# Patient Record
Sex: Female | Born: 1960 | Race: Black or African American | Hispanic: No | Marital: Married
Health system: Southern US, Community
[De-identification: ages and names within clinical notes are randomized; demographics above are authoritative.]

---

## 1998-02-16 ENCOUNTER — Other Ambulatory Visit: Admission: RE | Admit: 1998-02-16 | Discharge: 1998-02-16 | Payer: Self-pay | Admitting: Obstetrics and Gynecology

## 1999-02-02 ENCOUNTER — Other Ambulatory Visit: Admission: RE | Admit: 1999-02-02 | Discharge: 1999-02-02 | Payer: Self-pay | Admitting: *Deleted

## 1999-05-31 ENCOUNTER — Ambulatory Visit (HOSPITAL_COMMUNITY): Admission: RE | Admit: 1999-05-31 | Discharge: 1999-05-31 | Payer: Self-pay | Admitting: Family Medicine

## 1999-05-31 ENCOUNTER — Encounter: Payer: Self-pay | Admitting: Family Medicine

## 1999-06-20 ENCOUNTER — Other Ambulatory Visit: Admission: RE | Admit: 1999-06-20 | Discharge: 1999-06-20 | Payer: Self-pay | Admitting: Obstetrics and Gynecology

## 2003-09-06 ENCOUNTER — Other Ambulatory Visit: Admission: RE | Admit: 2003-09-06 | Discharge: 2003-09-06 | Payer: Self-pay | Admitting: Obstetrics and Gynecology

## 2006-02-14 ENCOUNTER — Encounter: Admission: RE | Admit: 2006-02-14 | Discharge: 2006-02-14 | Payer: Self-pay | Admitting: Orthopedic Surgery

## 2006-03-11 ENCOUNTER — Encounter: Admission: RE | Admit: 2006-03-11 | Discharge: 2006-03-11 | Payer: Self-pay | Admitting: Orthopedic Surgery

## 2006-03-13 ENCOUNTER — Encounter: Admission: RE | Admit: 2006-03-13 | Discharge: 2006-04-17 | Payer: Self-pay | Admitting: Orthopedic Surgery

## 2006-05-07 ENCOUNTER — Other Ambulatory Visit: Admission: RE | Admit: 2006-05-07 | Discharge: 2006-05-07 | Payer: Self-pay | Admitting: Obstetrics and Gynecology

## 2006-07-18 ENCOUNTER — Encounter: Admission: RE | Admit: 2006-07-18 | Discharge: 2006-07-18 | Payer: Self-pay | Admitting: Family Medicine

## 2008-05-10 ENCOUNTER — Encounter: Admission: RE | Admit: 2008-05-10 | Discharge: 2008-05-10 | Payer: Self-pay | Admitting: Family Medicine

## 2009-02-24 ENCOUNTER — Encounter: Admission: RE | Admit: 2009-02-24 | Discharge: 2009-02-24 | Payer: Self-pay | Admitting: Family Medicine

## 2021-03-07 ENCOUNTER — Other Ambulatory Visit: Payer: Self-pay | Admitting: Nurse Practitioner

## 2021-03-07 DIAGNOSIS — R52 Pain, unspecified: Secondary | ICD-10-CM

## 2021-03-08 ENCOUNTER — Ambulatory Visit
Admission: RE | Admit: 2021-03-08 | Discharge: 2021-03-08 | Disposition: A | Discharge status: Home / Self Care | Payer: BLUE CROSS/BLUE SHIELD | Source: Ambulatory Visit | Attending: Nurse Practitioner | Admitting: Nurse Practitioner

## 2021-03-08 ENCOUNTER — Other Ambulatory Visit: Payer: Self-pay

## 2021-03-08 DIAGNOSIS — R52 Pain, unspecified: Secondary | ICD-10-CM

## 2021-07-04 IMAGING — CR DG KNEE COMPLETE 4+V*L*
4 series · 4 of 4 positions shown · non-contrast
Comparison: None.

CLINICAL DATA: 59-year-old female with left knee pain. No known
injury.

EXAM:
LEFT KNEE - COMPLETE 4+ VIEW

[t knee ap left]
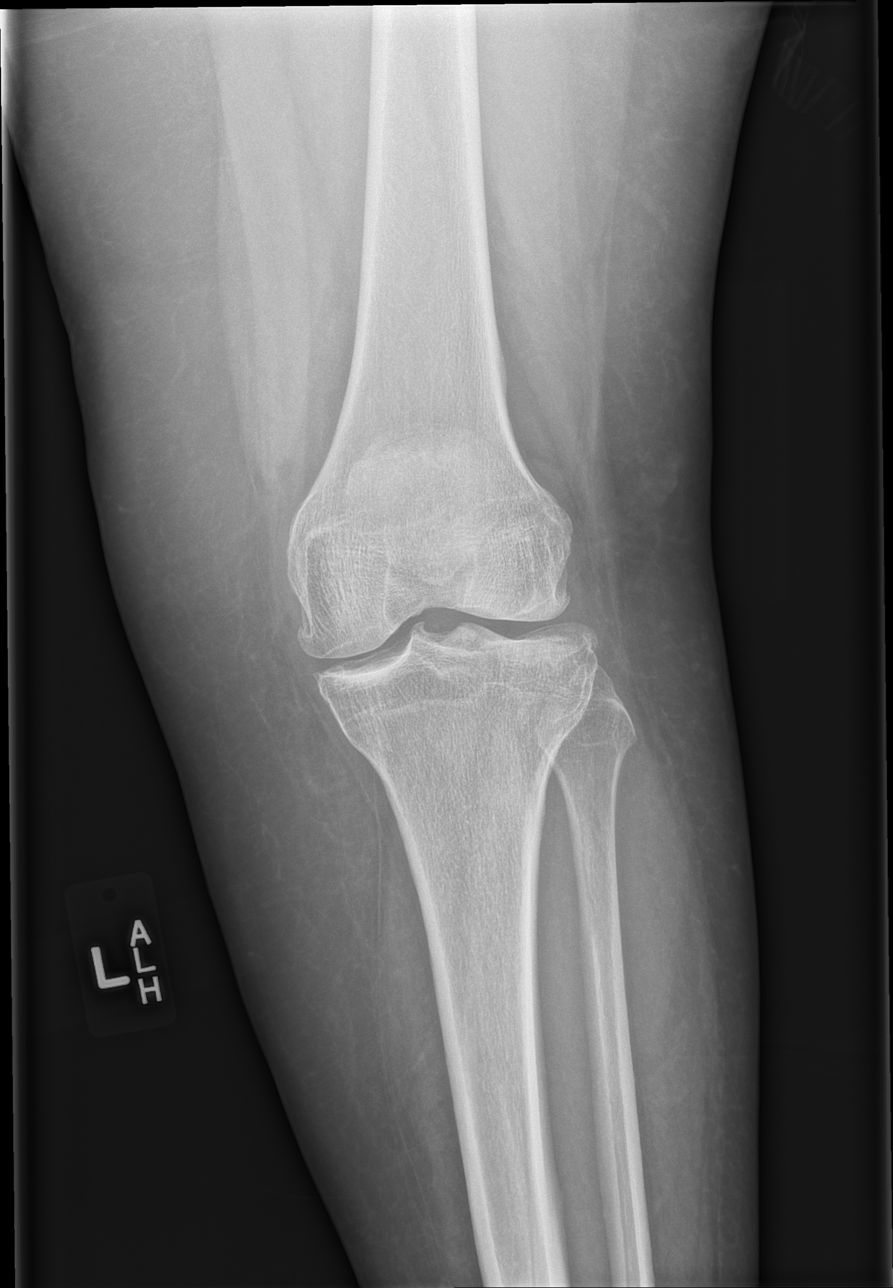

[t knee obl left (1 of 2)]
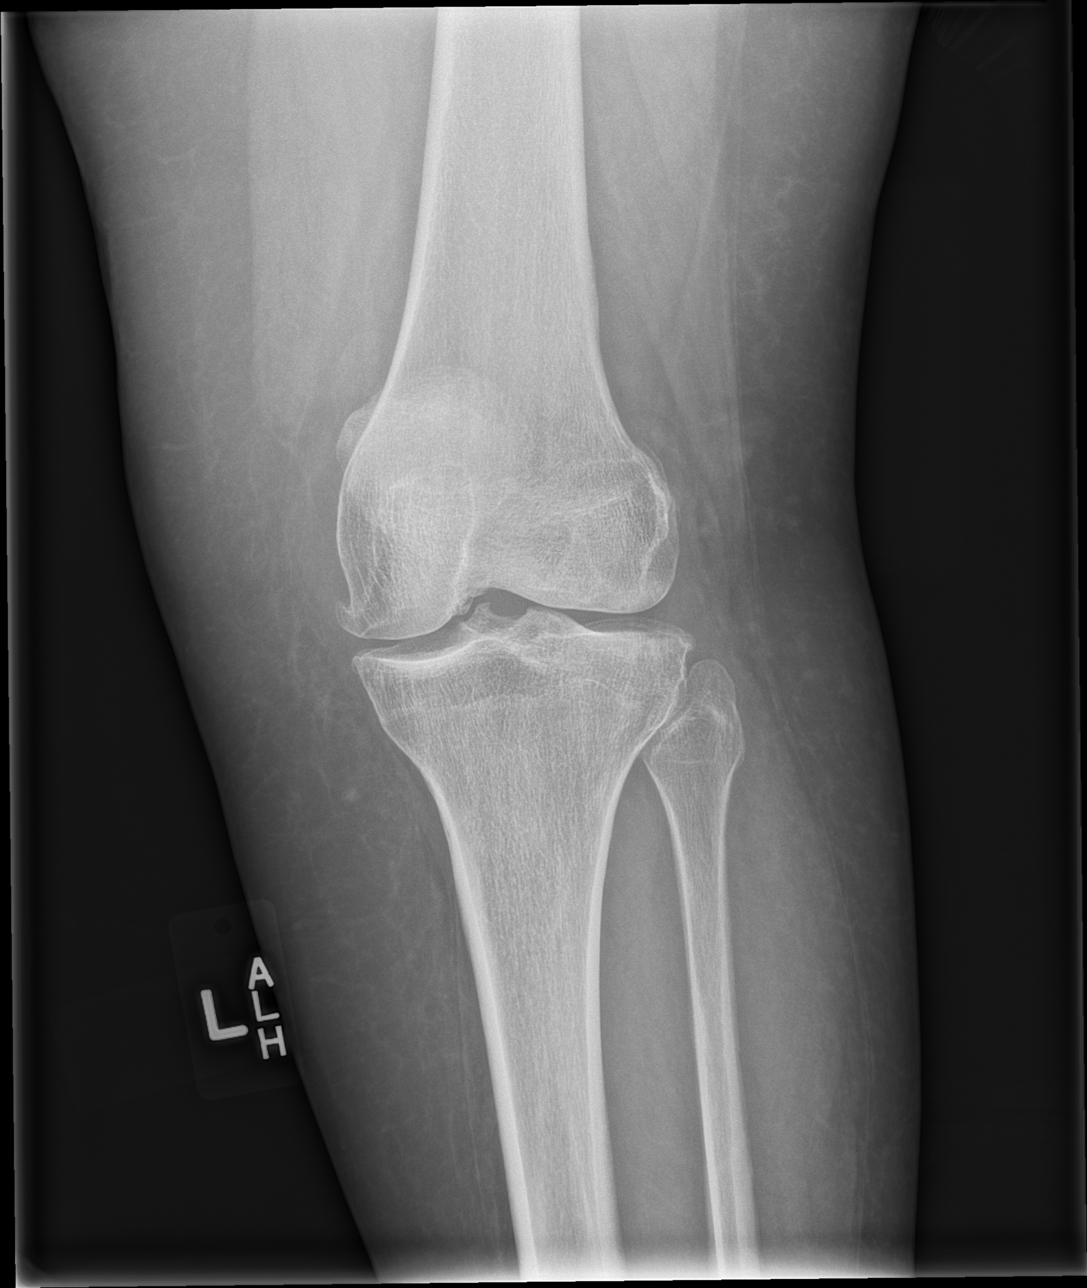

[t knee obl left (2 of 2)]
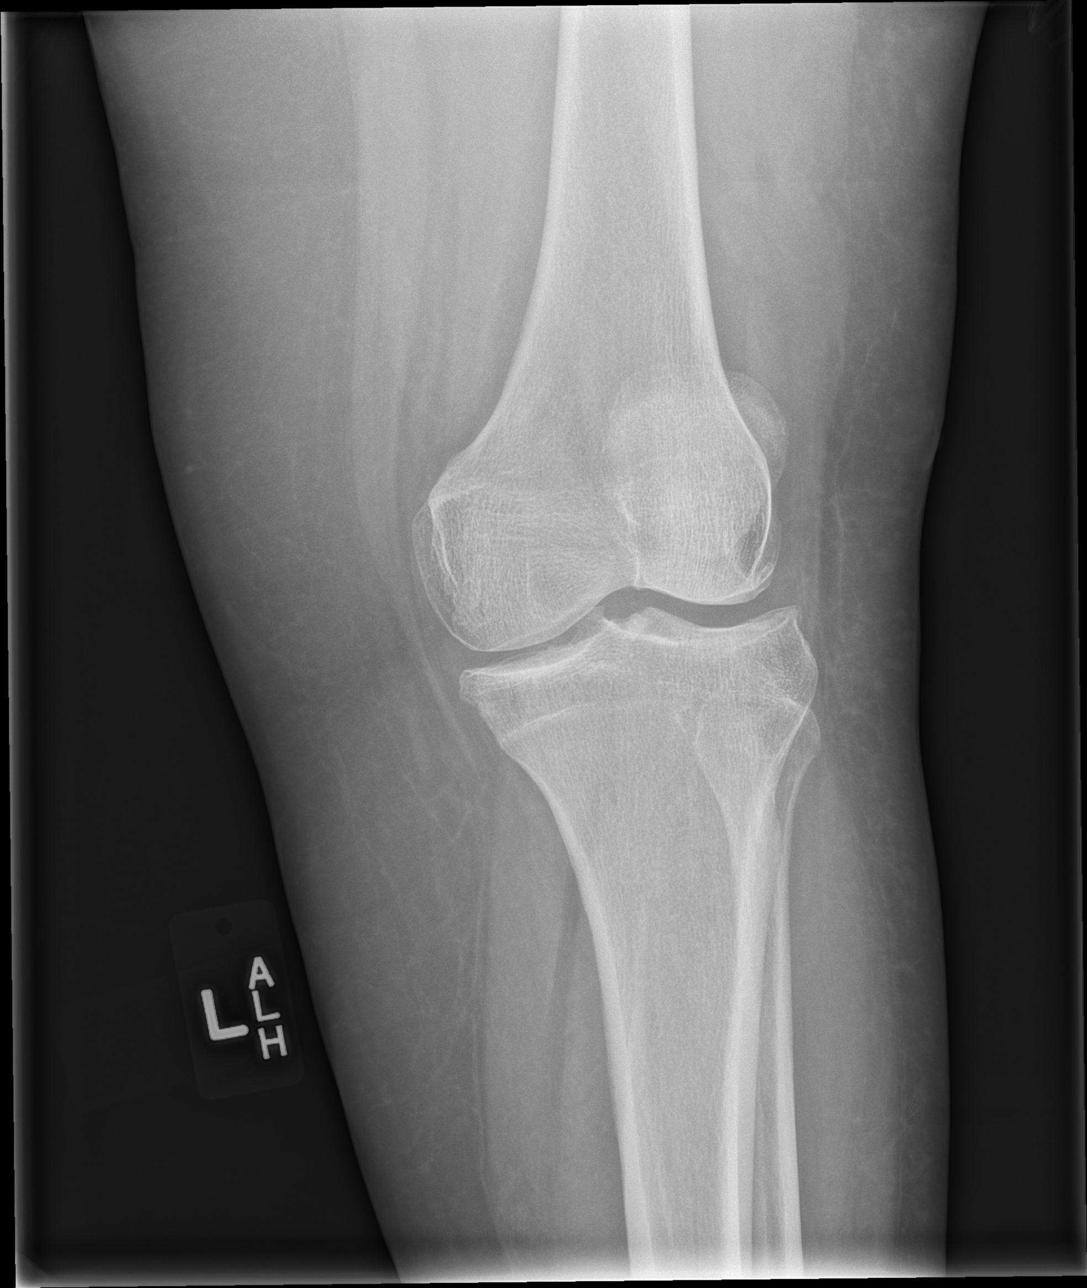

[t knee lat left]
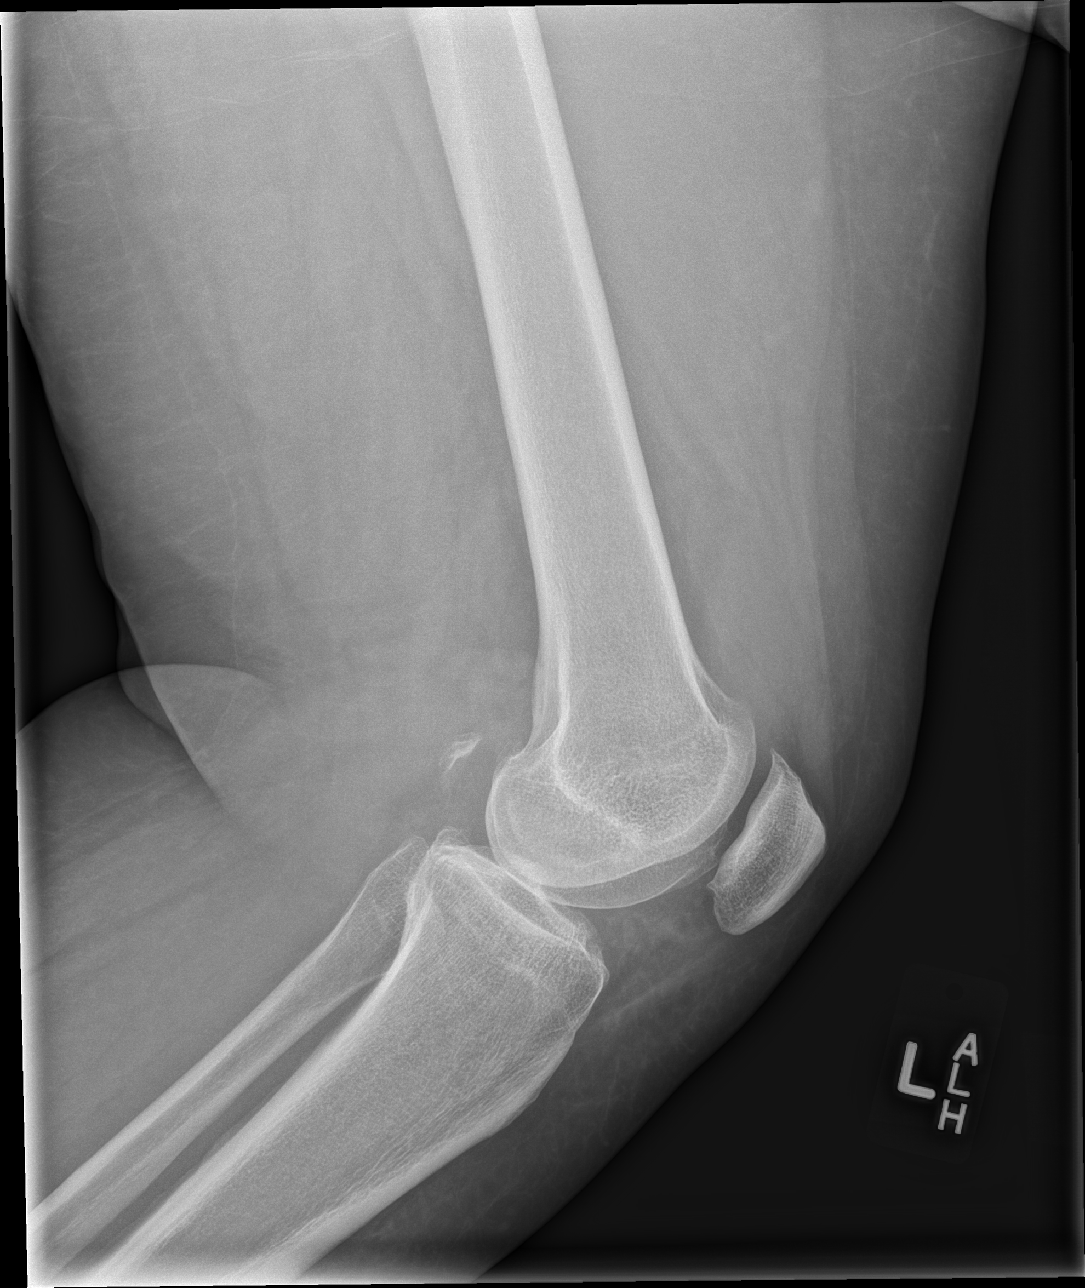

[4 of 4 positions shown; findings below may reference images not displayed]

FINDINGS: There is no acute fracture or dislocation. Mild osteopenia. There is
mild to moderate arthritic changes with tricompartmental narrowing
and spurring. There is a moderate size suprapatellar effusion.
Chronic calcification of the posterior aspect of the knee may
represent tendinopathy, or calcification within a Baker's cyst.
Ultrasound or MRI may provide better evaluation if clinically
indicated. The soft tissues are otherwise unremarkable.
IMPRESSION: 1. No acute fracture or dislocation.
2. Mild to moderate arthritic changes with moderate suprapatellar
effusion.

## 2022-07-29 ENCOUNTER — Encounter (HOSPITAL_BASED_OUTPATIENT_CLINIC_OR_DEPARTMENT_OTHER): Payer: Self-pay | Admitting: Pediatrics

## 2022-07-29 ENCOUNTER — Other Ambulatory Visit: Payer: Self-pay

## 2022-07-29 ENCOUNTER — Emergency Department (HOSPITAL_BASED_OUTPATIENT_CLINIC_OR_DEPARTMENT_OTHER)
Admission: EM | Admit: 2022-07-29 | Discharge: 2022-07-29 | Disposition: A | Discharge status: Home / Self Care | Payer: BC Managed Care – PPO | Attending: Emergency Medicine | Admitting: Emergency Medicine

## 2022-07-29 DIAGNOSIS — W57XXXA Bitten or stung by nonvenomous insect and other nonvenomous arthropods, initial encounter: Secondary | ICD-10-CM | POA: Insufficient documentation

## 2022-07-29 DIAGNOSIS — S80861A Insect bite (nonvenomous), right lower leg, initial encounter: Secondary | ICD-10-CM | POA: Diagnosis not present

## 2022-07-29 DIAGNOSIS — S8991XA Unspecified injury of right lower leg, initial encounter: Secondary | ICD-10-CM | POA: Diagnosis present

## 2022-07-29 LAB — CBC WITH DIFFERENTIAL/PLATELET
Abs Immature Granulocytes: 0.01 10*3/uL (ref 0.00–0.07)
Basophils Absolute: 0 10*3/uL (ref 0.0–0.1)
Basophils Relative: 0 %
Eosinophils Absolute: 0.2 10*3/uL (ref 0.0–0.5)
Eosinophils Relative: 3 %
HCT: 40.3 % (ref 36.0–46.0)
Hemoglobin: 13 g/dL (ref 12.0–15.0)
Immature Granulocytes: 0 %
Lymphocytes Relative: 38 %
Lymphs Abs: 2.3 10*3/uL (ref 0.7–4.0)
MCH: 28.2 pg (ref 26.0–34.0)
MCHC: 32.3 g/dL (ref 30.0–36.0)
MCV: 87.4 fL (ref 80.0–100.0)
Monocytes Absolute: 0.5 10*3/uL (ref 0.1–1.0)
Monocytes Relative: 8 %
Neutro Abs: 3 10*3/uL (ref 1.7–7.7)
Neutrophils Relative %: 51 %
Platelets: 255 10*3/uL (ref 150–400)
RBC: 4.61 MIL/uL (ref 3.87–5.11)
RDW: 14.1 % (ref 11.5–15.5)
WBC: 5.9 10*3/uL (ref 4.0–10.5)
nRBC: 0 % (ref 0.0–0.2)

## 2022-07-29 LAB — COMPREHENSIVE METABOLIC PANEL
ALT: 15 U/L (ref 0–44)
AST: 25 U/L (ref 15–41)
Albumin: 4.2 g/dL (ref 3.5–5.0)
Alkaline Phosphatase: 63 U/L (ref 38–126)
Anion gap: 8 (ref 5–15)
BUN: 16 mg/dL (ref 6–20)
CO2: 28 mmol/L (ref 22–32)
Calcium: 9 mg/dL (ref 8.9–10.3)
Chloride: 103 mmol/L (ref 98–111)
Creatinine, Ser: 0.94 mg/dL (ref 0.44–1.00)
GFR, Estimated: >60 mL/min (ref >60)
Glucose, Bld: 85 mg/dL (ref 70–99)
Potassium: 3.7 mmol/L (ref 3.5–5.1)
Sodium: 139 mmol/L (ref 135–145)
Total Bilirubin: 0.5 mg/dL (ref 0.3–1.2)
Total Protein: 7.7 g/dL (ref 6.5–8.1)

## 2022-07-29 MED ORDER — CEPHALEXIN 500 MG PO CAPS: 500.0000 mg | ORAL_CAPSULE | Freq: Four times a day (QID) | ORAL | 0 refills | Status: AC

## 2022-07-29 NOTE — ED Triage Notes (Signed)
Concern for potential insect bite on right knee area; concern for tick, spider bite

## 2022-07-29 NOTE — ED Notes (Signed)
Provider at bedside

## 2022-07-29 NOTE — Discharge Instructions (Signed)
Your labs today are very reassuring.  Please start taking antibiotics. If you have continued symptoms you can follow up with your PCP. If you develop fevers or worsening symptoms, please return to the emergency department.

## 2022-07-29 NOTE — ED Notes (Signed)
Patient C/O wait time and requesting to know how much longer. Apologized for delay, informed patient that provider should be in shortly to see her. Food and drink provided

## 2022-07-29 NOTE — ED Provider Notes (Signed)
[facility] Provider Note   CSN: [id number] Arrival date & time: 07/29/22  1603     History No known past medical history  Chief Complaint  Patient presents with   Insect Bite    Kylie Thompson is a 61 y.o. female.  Patient presenting with concern for insect bite on her right leg.  She says on Saturday she noticed 5 areas on her skin that look like bite marks.  She has had redness and swelling around the area.  She is concerned for possible spider bite or tick bite.  He has not had any fevers, chills, nausea, vomiting, confusion, numbness, or tingling in this extremity.  HPI     Home Medications Prior to Admission medications   Medication Sig Start Date End Date Taking? Authorizing Provider  cephALEXin (KEFLEX) 500 MG capsule Take 1 capsule (500 mg total) by mouth 4 (four) times daily for 5 days. 07/29/22 08/03/22 Yes Sherald Balbuena, Adora Fridge, PA-C      Allergies    Patient has no known allergies.    Review of Systems   Review of Systems  Skin:  Positive for color change.  All other systems reviewed and are negative.   Physical Exam Updated Vital Signs BP (!) 140/84 (BP Location: Left Arm)   Pulse 77   Temp 98.2 F (36.8 C) (Oral)   Resp 16   Ht 5\' 7"  (1.702 m)   Wt 99.8 kg   SpO2 100%   BMI 34.46 kg/m  Physical Exam Vitals and nursing note reviewed.  Constitutional:      General: She is not in acute distress.    Appearance: Normal appearance. She is well-developed. She is not ill-appearing, toxic-appearing or diaphoretic.  HENT:     Head: Normocephalic and atraumatic.     Nose: No nasal deformity.     Mouth/Throat:     Lips: Pink. No lesions.  Eyes:     General: Gaze aligned appropriately. No scleral icterus.       Right eye: No discharge.        Left eye: No discharge.     Conjunctiva/sclera: Conjunctivae normal.     Right eye: Right conjunctiva is not injected. No exudate or hemorrhage.    Left eye: Left conjunctiva is not  injected. No exudate or hemorrhage. Pulmonary:     Effort: Pulmonary effort is normal. No respiratory distress.  Skin:    General: Skin is warm and dry.     Comments: There are 5 small papules with some slight surrounding erythema. there is no target lesion.  Range of motion of the knee is intact without significant pain.  She has no posterior calf tenderness.  2+ pedal pulses and sensation intact bilaterally.  Photo below  Neurological:     Mental Status: She is alert and oriented to person, place, and time.  Psychiatric:        Mood and Affect: Mood normal.        Speech: Speech normal.        Behavior: Behavior normal. Behavior is cooperative.      ED Results / Procedures / Treatments   Labs (all labs ordered are listed, but only abnormal results are displayed) Labs Reviewed  CBC WITH DIFFERENTIAL/PLATELET  COMPREHENSIVE METABOLIC PANEL    EKG None  Radiology No results found.  Procedures Procedures   Medications Ordered in ED Medications - No data to display  ED Course/ Medical Decision Making/ A&P  Medical Decision Making Amount and/or Complexity of Data Reviewed Labs: ordered.   This patient is here with bite marks on her right lower leg.  There is maybe some slight erythema surrounding them that could indicate possible superimposed infection.  Her leg does not look overtly cellulitic.  I have a low suspicion for DVT.  I have no suspicion for septic joint.  She is neurovascularly intact on exam.  There is no signs to point in direction of Lyme disease or any other tickborne illness.  She has no systemic symptoms for possible poisonous spider bite.  She did have labs done that were ordered from triage which show no leukocytosis, anemia, thrombocytopenia.  Her electrolytes are normal.  She has normal renal function and LFTs.  Do not feel there is an emergent cause to her symptoms today.  I will prescribe her Keflex outpatient.  But she should  follow-up with her PCP.   Final Clinical Impression(s) / ED Diagnoses Final diagnoses:  Insect bite of right lower leg, initial encounter    Rx / DC Orders ED Discharge Orders          Ordered    cephALEXin (KEFLEX) 500 MG capsule  4 times daily        07/29/22 1950              Therese Sarah 07/29/22 1951    Tegeler, Canary Brim, MD 07/29/22 2355

## 2023-09-18 ENCOUNTER — Other Ambulatory Visit: Payer: Self-pay | Admitting: Family Medicine

## 2023-09-18 ENCOUNTER — Ambulatory Visit
Admission: RE | Admit: 2023-09-18 | Discharge: 2023-09-18 | Disposition: A | Discharge status: Home / Self Care | Payer: BC Managed Care – PPO | Source: Ambulatory Visit | Attending: Family Medicine | Admitting: Family Medicine

## 2023-09-18 DIAGNOSIS — M25579 Pain in unspecified ankle and joints of unspecified foot: Secondary | ICD-10-CM
